# Patient Record
Sex: Male | Born: 1970 | Race: White | Hispanic: No | Marital: Married | Smoking: Never smoker
Health system: Southern US, Community
[De-identification: ages and names within clinical notes are randomized; demographics above are authoritative.]

---

## 2018-04-22 ENCOUNTER — Other Ambulatory Visit: Payer: Self-pay

## 2018-04-22 ENCOUNTER — Emergency Department (HOSPITAL_BASED_OUTPATIENT_CLINIC_OR_DEPARTMENT_OTHER)
Admission: EM | Admit: 2018-04-22 | Discharge: 2018-04-22 | Disposition: A | Discharge status: Home / Self Care | Payer: 59 | Attending: Emergency Medicine | Admitting: Emergency Medicine

## 2018-04-22 ENCOUNTER — Emergency Department (HOSPITAL_BASED_OUTPATIENT_CLINIC_OR_DEPARTMENT_OTHER): Payer: 59

## 2018-04-22 ENCOUNTER — Encounter (HOSPITAL_BASED_OUTPATIENT_CLINIC_OR_DEPARTMENT_OTHER): Payer: Self-pay | Admitting: Emergency Medicine

## 2018-04-22 DIAGNOSIS — F17220 Nicotine dependence, chewing tobacco, uncomplicated: Secondary | ICD-10-CM | POA: Insufficient documentation

## 2018-04-22 DIAGNOSIS — M5442 Lumbago with sciatica, left side: Secondary | ICD-10-CM | POA: Insufficient documentation

## 2018-04-22 DIAGNOSIS — M545 Low back pain: Secondary | ICD-10-CM | POA: Diagnosis present

## 2018-04-22 MED ORDER — PREDNISONE 10 MG PO TABS: 40.0000 mg | ORAL_TABLET | Freq: Every day | ORAL | 0 refills | Status: AC

## 2018-04-22 MED ORDER — METHOCARBAMOL 500 MG PO TABS
500.0000 mg | ORAL_TABLET | Freq: Once | ORAL | Status: AC
  Administered 2018-04-22: 500 mg via ORAL
  Filled 2018-04-22: qty 1

## 2018-04-22 MED ORDER — METHOCARBAMOL 500 MG PO TABS: 500.0000 mg | ORAL_TABLET | Freq: Every evening | ORAL | 0 refills | Status: AC | PRN

## 2018-04-22 MED ORDER — PREDNISONE 50 MG PO TABS
60.0000 mg | ORAL_TABLET | Freq: Once | ORAL | Status: AC
  Administered 2018-04-22: 60 mg via ORAL
  Filled 2018-04-22: qty 1

## 2018-04-22 NOTE — ED Notes (Signed)
ED Provider at bedside. 

## 2018-04-22 NOTE — ED Notes (Signed)
Patient transported to X-ray 

## 2018-04-22 NOTE — ED Triage Notes (Addendum)
PT presents with middle and right lower back pain. 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 PT having difficulty walking sitting and standing.

## 2018-04-22 NOTE — ED Provider Notes (Signed)
[facility] EMERGENCY DEPARTMENT Provider Note   CSN: [id number] Arrival date & time: 04/22/18  2130     History   Chief Complaint Chief Complaint  Patient presents with  . Back Pain    HPI Willie Harrington is a 47 y.o. male with no past medical history presenting with sudden onset left lower back pain and midline since last night, he reports waking up this morning with severe pain having difficulty lying flat on his back.  Pain radiates down his left lower extremity.  He denies any numbness or weakness.  No injury or trauma.  Patient does report heavy lifting with a disabled son who is now grown and heavy.  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  He had been doing lighter work for a year and a half.  Has taken ibuprofen with modest relief yesterday.  He denies history of IV drug use, malignancy, recent weight loss, night sweats, urinary symptoms, fever, chills.  HPI  History reviewed. No pertinent past medical history.  There are no active problems to display for this patient.   History reviewed. No pertinent surgical history.      Home Medications    Prior to Admission medications   Medication Sig Start Date End Date Taking? Authorizing Provider  methocarbamol (ROBAXIN) 500 MG tablet Take 1 tablet (500 mg total) by mouth at bedtime as needed. 04/22/18   Georgiana Shore, PA-C  predniSONE (DELTASONE) 10 MG tablet Take 4 tablets (40 mg total) by mouth daily for 4 days. 04/22/18 04/26/18  Georgiana Shore, PA-C    Family History No family history on file.  Social History Social History   Tobacco Use  . Smoking status: Never Smoker  . Smokeless tobacco: Current User    Types: Chew  Substance Use Topics  . Alcohol use: Never    Frequency: Never  . Drug use: Never     Allergies   Patient has no known allergies.   Review of Systems Review of Systems  Constitutional: Negative for chills,  diaphoresis, fatigue, fever and unexpected weight change.  Respiratory: Negative for cough, choking, shortness of breath, wheezing and stridor.   Cardiovascular: Negative for chest pain.  Gastrointestinal: Negative for abdominal pain, nausea and vomiting.  Genitourinary: Negative for difficulty urinating, dysuria, flank pain, frequency and hematuria.  Musculoskeletal: Positive for arthralgias, back pain and myalgias. Negative for neck pain and neck stiffness.       Intermittent left medial knee pain for the past 2 years. Not at this time.   Skin: Negative for color change, pallor and rash.  Neurological: Negative for dizziness, weakness, light-headedness, numbness and headaches.     Physical Exam Updated Vital Signs BP (!) 130/97 (BP Location: Right Arm)   Pulse 63   Temp 97.8 F (36.6 C) (Oral)   Resp 18   Ht  (1.753 m)   Wt 84.8 kg (187 lb)   SpO2 100%   BMI 27.62 kg/m   Physical Exam  Constitutional: He appears well-developed and well-nourished. No distress.  Afebrile, nontoxic-appearing, sitting comfortably in chair in no acute distress.  HENT:  Head: Normocephalic and atraumatic.  Eyes: Conjunctivae are normal.  Neck: Normal range of motion. Neck supple.  Cardiovascular: Normal rate and intact distal pulses.  No murmur heard. Pulmonary/Chest: Effort normal. No respiratory distress.  Abdominal: He exhibits no distension.  Musculoskeletal: Normal range of motion. He exhibits tenderness. He exhibits no  edema.  Positive straight leg raise on the left.  Midline tenderness to palpation of the lumbar spine especially at L4-L5.  Left knee is nonedematous, nonerythematous and without warmth.  Full range of motion.  Neurological: He is alert. No sensory deficit. He exhibits normal muscle tone.  5/5 strength in lower extremities bilaterally, normal stance and gait.  Patient intact, strong dorsalis pedis pulses.  Neurovascularly intact.  Skin: Skin is warm and dry. No rash  noted. He is not diaphoretic. No erythema. No pallor.  Psychiatric: He has a normal mood and affect.  Nursing note and vitals reviewed.    ED Treatments / Results  Labs (all labs ordered are listed, but only abnormal results are displayed) Labs Reviewed - No data to display  EKG None  Radiology Dg Lumbar Spine Complete  Result Date: 04/22/2018 CLINICAL DATA:  Acute pain.  Tenderness to palpation at L4-5 EXAM: LUMBAR SPINE - COMPLETE 4+ VIEW COMPARISON:  None. FINDINGS: No evidence of fracture, discitis, or aggressive bone lesion. 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 Negative facets. Mild lumbarization of S1. IMPRESSION: 1. No acute finding. 2. 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 Electronically Signed   By: Marnee Spring M.D.   On: 04/22/2018 12:08    Procedures Procedures (including critical care time)  Medications Ordered in ED Medications  predniSONE (DELTASONE) tablet 60 mg (60 mg Oral Given 04/22/18 1152)  methocarbamol (ROBAXIN) tablet 500 mg (500 mg Oral Given 04/22/18 1152)     Initial Impression / Assessment and Plan / ED Course  I have reviewed the triage vital signs and the nursing notes.  Pertinent labs & imaging results that were available during my care of the patient were reviewed by me and considered in my medical decision making (see chart for details).    Patient presenting with sudden onset left-sided lower back pain radiating down the left lower extremity.  Positive straight leg raise.  This palpation midline at L4-L5. His history and physical findings concerning for disc herniation.  Lumbar films with space narrowing at L4-L5 consistent with patient's symptoms and physical exam findings. Symptoms were managed while in the emergency department and he improved.  Patient presents with lower back pain.  No gross neurological deficits and normal neuro exam.  Patient has no gait abnormality or concern for cauda equina.  No loss of  bowel or bladder control, fever, night sweats, weight loss, h/o malignancy, or IVDU.  RICE protocol and pain medications indicated and discussed with patient.   Discharge home with symptomatic relief and close follow-up with neurosurgery.  Discussed strict return precautions and advised to return to the emergency department if experiencing any new or worsening symptoms. Instructions were understood and patient agreed with discharge plan.  Final Clinical Impressions(s) / ED Diagnoses   Final diagnoses:  Acute left-sided low back pain with left-sided sciatica    ED Discharge Orders        Ordered    predniSONE (DELTASONE) 10 MG tablet  Daily     04/22/18 1256    methocarbamol (ROBAXIN) 500 MG tablet  At bedtime PRN     04/22/18 1256       Gregary Cromer 04/22/18 1749    Loren Racer, MD 04/23/18 [phone]

## 2018-04-22 NOTE — Discharge Instructions (Signed)
As discussed, your xray showed disc space narrowing between L4-L5 which is consistent with the area where you are reporting pain. The medicine prescribed can help with muscle spasm but cannot be taken if driving, with alcohol or operating machinery.  Take at night time as needed or every 8 hours as needed. Take prednisone for the next 4 days starting tomorrow and start taking ibuprofen 800 every 8 hours after you have completed your 4 day course of prednisone.  Massage and muscle creams may also be helpful for muscle spasm. Follow up with your primary care provider and the spine specialist.  Return if worsening, loss of bowel or bladder function, numbness, fever, chills or new concerning symptoms in the meantime.

## 2019-10-21 IMAGING — CR DG LUMBAR SPINE COMPLETE 4+V
5 series · 5 of 5 positions shown · non-contrast
Comparison: None.

CLINICAL DATA: Acute pain.  Tenderness to palpation at L4-5

EXAM:
LUMBAR SPINE - COMPLETE 4+ VIEW

[t l-spine a.p.]
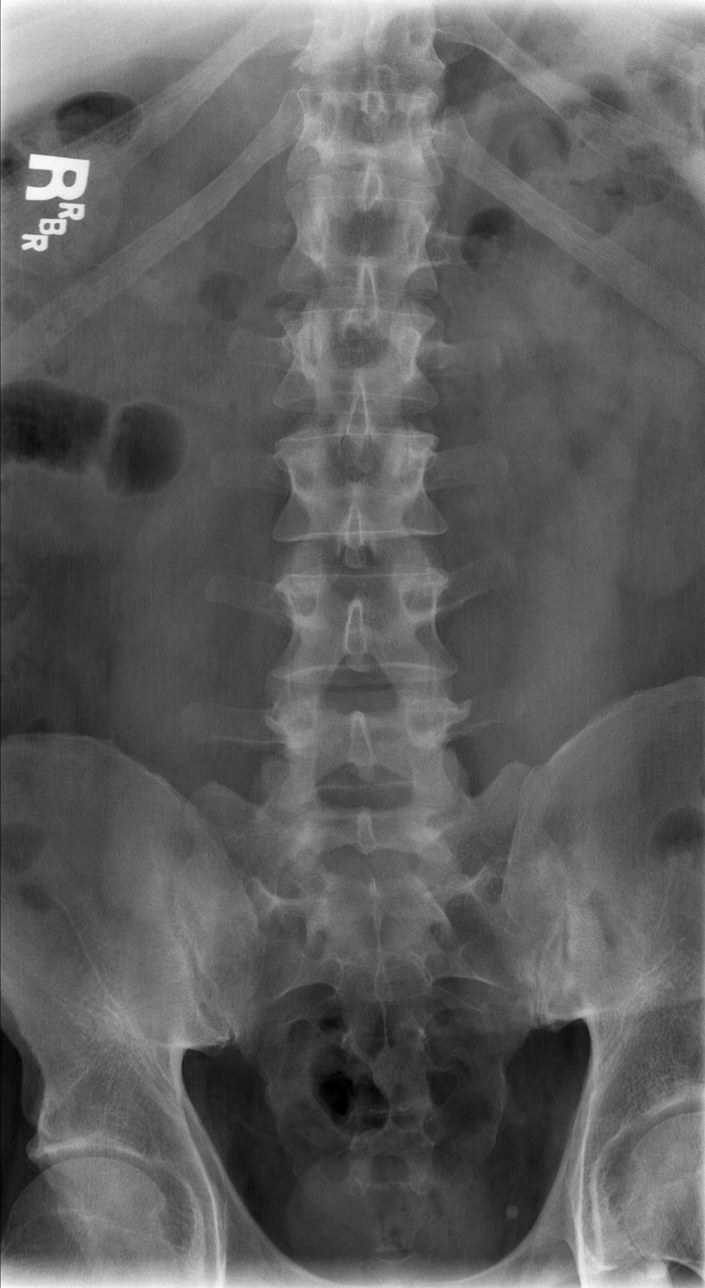

[t l-spine oblique exposure (1 of 2)]
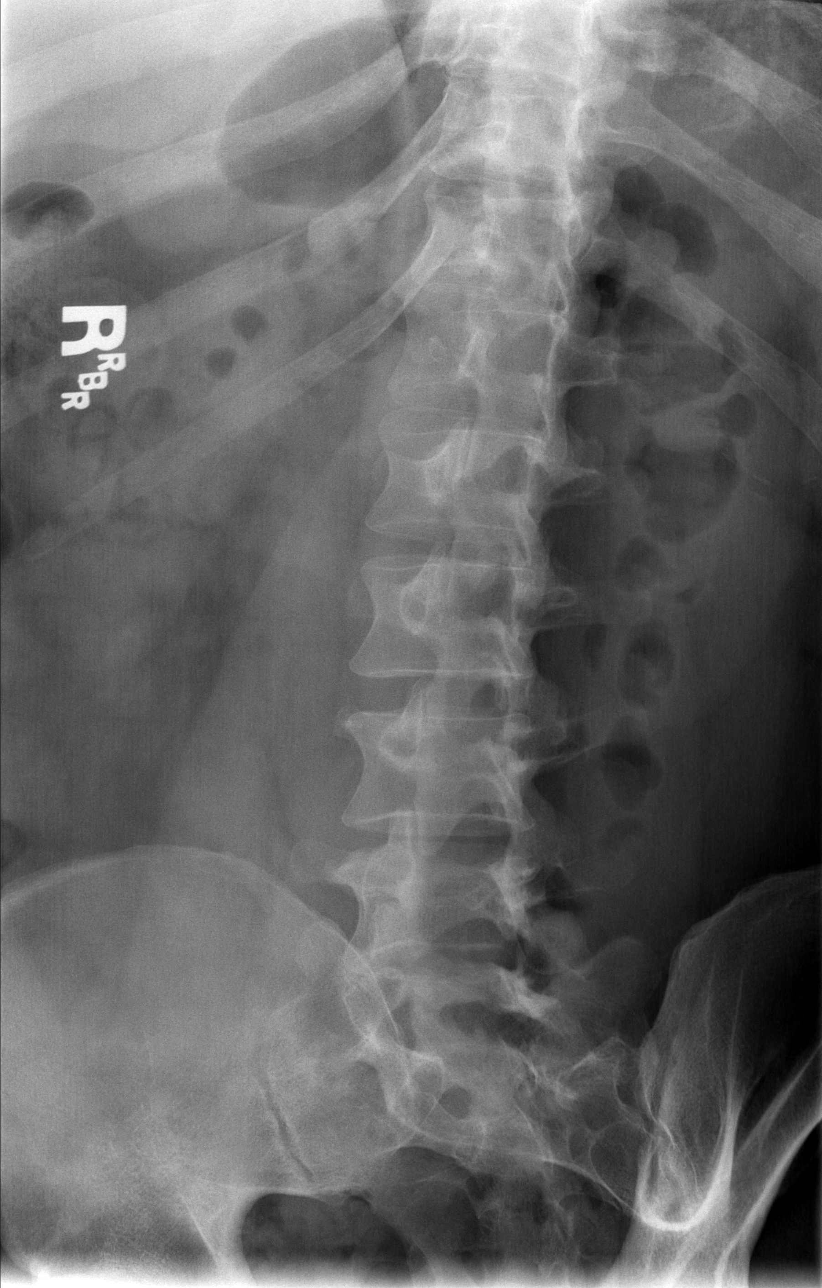

[t l-spine oblique exposure (2 of 2)]
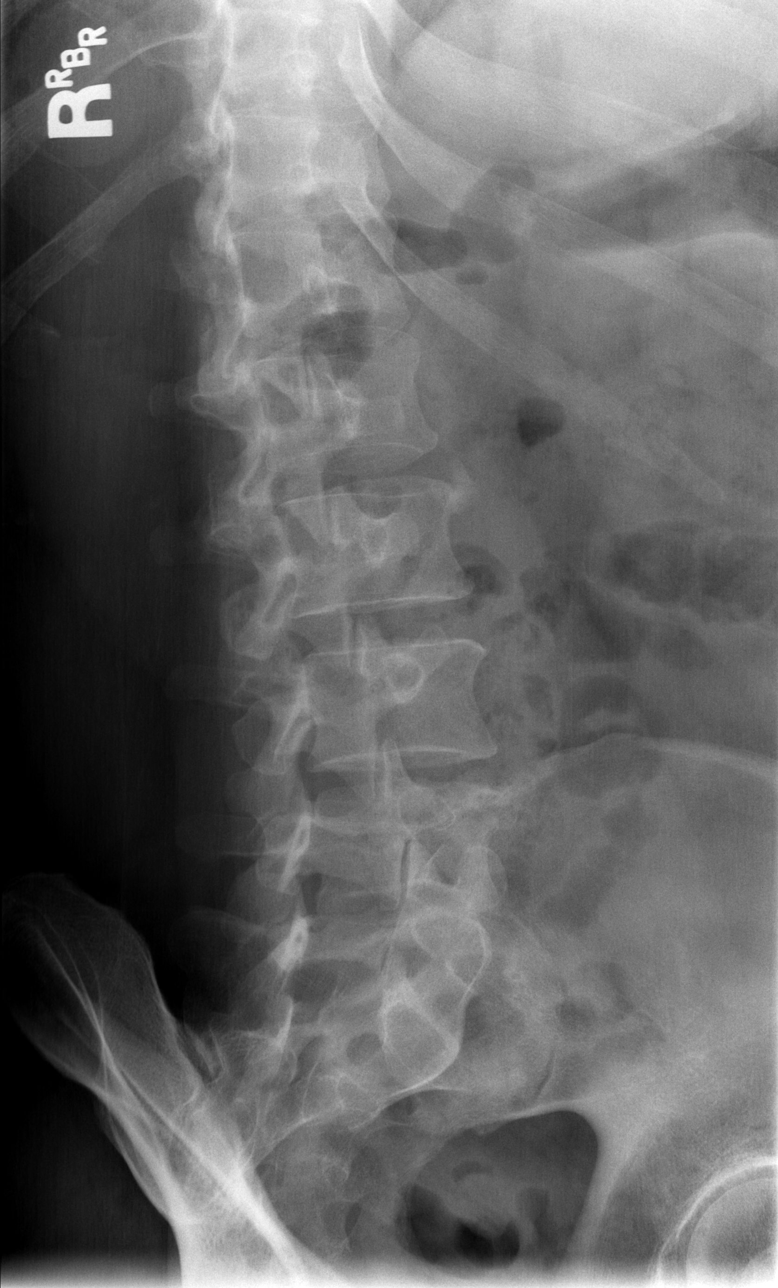

[t l-spine lat]
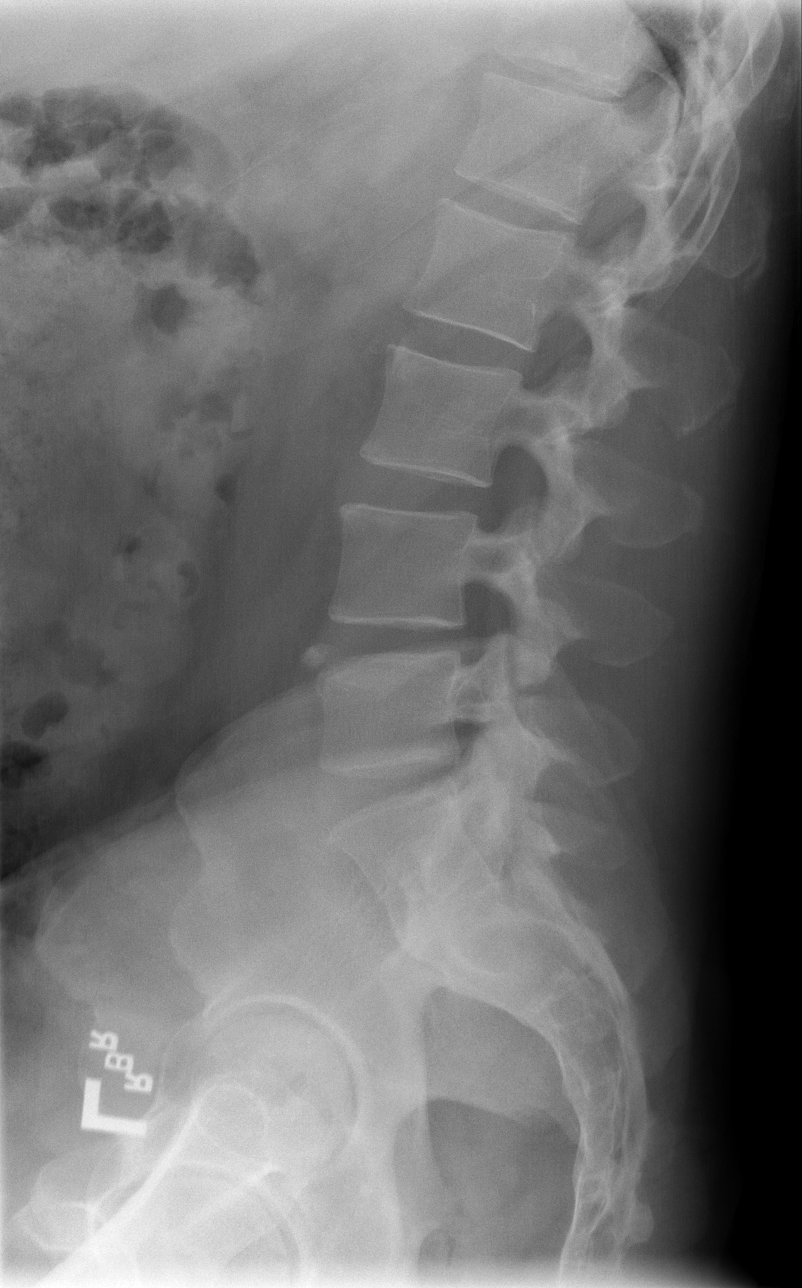

[t l-spine l5-s1 spot]
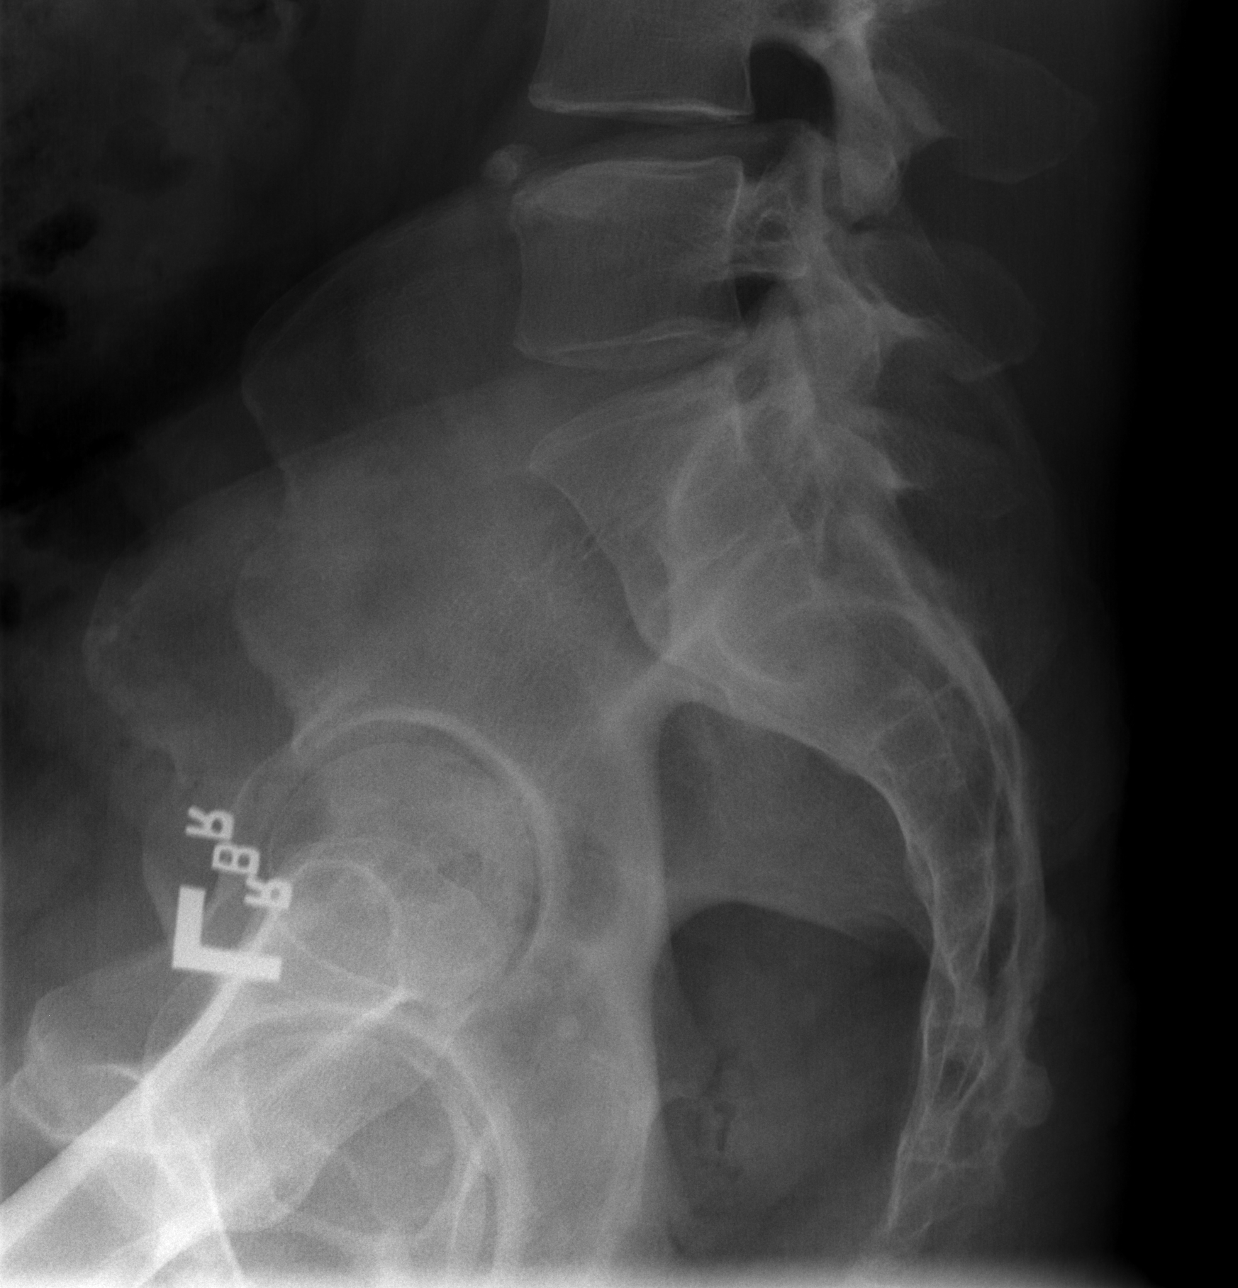

[5 of 5 positions shown; findings below may reference images not displayed]

FINDINGS: No evidence of fracture, discitis, or aggressive bone lesion. Limbus
vertebra at L5 with mild L4-5 disc narrowing. Negative facets.

Mild lumbarization of S1.
IMPRESSION: 1. No acute finding.
2. L5 limbus vertebra with mild L4-5 disc narrowing.
